# Patient Record
Sex: Female | Born: 1955 | Race: White | Hispanic: No | Marital: Married | Smoking: Never smoker
Health system: Southern US, Community
[De-identification: ages and names within clinical notes are randomized; demographics above are authoritative.]

## PROBLEM LIST (undated history)

## (undated) DIAGNOSIS — R87619 Unspecified abnormal cytological findings in specimens from cervix uteri: Secondary | ICD-10-CM

## (undated) DIAGNOSIS — M48061 Spinal stenosis, lumbar region without neurogenic claudication: Secondary | ICD-10-CM

## (undated) HISTORY — PX: TUBAL LIGATION: SHX77

## (undated) HISTORY — DX: Unspecified abnormal cytological findings in specimens from cervix uteri: R87.619

## (undated) HISTORY — DX: Spinal stenosis, lumbar region without neurogenic claudication: M48.061

## (undated) HISTORY — PX: COLONOSCOPY: SHX174

---

## 2000-04-02 ENCOUNTER — Encounter: Admission: RE | Admit: 2000-04-02 | Discharge: 2000-04-02 | Payer: Self-pay | Admitting: Family Medicine

## 2000-04-02 ENCOUNTER — Encounter: Payer: Self-pay | Admitting: Family Medicine

## 2001-07-22 ENCOUNTER — Encounter
Admission: RE | Admit: 2001-07-22 | Discharge: 2001-08-15 | Payer: Self-pay | Admitting: Physical Medicine and Rehabilitation

## 2001-09-26 ENCOUNTER — Ambulatory Visit (HOSPITAL_COMMUNITY): Admission: RE | Admit: 2001-09-26 | Discharge: 2001-09-26 | Payer: Self-pay | Admitting: *Deleted

## 2001-10-17 ENCOUNTER — Other Ambulatory Visit: Admission: RE | Admit: 2001-10-17 | Discharge: 2001-10-17 | Payer: Self-pay | Admitting: *Deleted

## 2003-11-24 ENCOUNTER — Encounter: Admission: RE | Admit: 2003-11-24 | Discharge: 2003-11-24 | Payer: Self-pay | Admitting: Obstetrics and Gynecology

## 2006-02-01 ENCOUNTER — Encounter: Admission: RE | Admit: 2006-02-01 | Discharge: 2006-02-01 | Payer: Self-pay | Admitting: Obstetrics and Gynecology

## 2007-07-03 ENCOUNTER — Encounter: Admission: RE | Admit: 2007-07-03 | Discharge: 2007-07-03 | Payer: Self-pay | Admitting: Obstetrics and Gynecology

## 2008-08-27 ENCOUNTER — Encounter: Admission: RE | Admit: 2008-08-27 | Discharge: 2008-08-27 | Payer: Self-pay | Admitting: Obstetrics and Gynecology

## 2008-12-28 ENCOUNTER — Encounter: Admission: RE | Admit: 2008-12-28 | Discharge: 2008-12-28 | Payer: Self-pay | Admitting: Orthopedic Surgery

## 2009-09-28 ENCOUNTER — Encounter: Admission: RE | Admit: 2009-09-28 | Discharge: 2009-09-28 | Payer: Self-pay | Admitting: Obstetrics and Gynecology

## 2011-03-30 ENCOUNTER — Other Ambulatory Visit: Payer: Self-pay | Admitting: Nurse Practitioner

## 2011-03-30 DIAGNOSIS — Z1231 Encounter for screening mammogram for malignant neoplasm of breast: Secondary | ICD-10-CM

## 2011-05-11 ENCOUNTER — Ambulatory Visit
Admission: RE | Admit: 2011-05-11 | Discharge: 2011-05-11 | Disposition: A | Discharge status: Home / Self Care | Payer: BC Managed Care – PPO | Source: Ambulatory Visit | Attending: Nurse Practitioner | Admitting: Nurse Practitioner

## 2011-05-11 ENCOUNTER — Other Ambulatory Visit: Payer: Self-pay | Admitting: Obstetrics and Gynecology

## 2011-05-11 DIAGNOSIS — Z1231 Encounter for screening mammogram for malignant neoplasm of breast: Secondary | ICD-10-CM

## 2011-05-17 ENCOUNTER — Other Ambulatory Visit: Payer: Self-pay | Admitting: Obstetrics and Gynecology

## 2011-05-19 ENCOUNTER — Other Ambulatory Visit: Payer: Self-pay | Admitting: Obstetrics and Gynecology

## 2011-05-19 DIAGNOSIS — R928 Other abnormal and inconclusive findings on diagnostic imaging of breast: Secondary | ICD-10-CM

## 2011-05-24 ENCOUNTER — Ambulatory Visit
Admission: RE | Admit: 2011-05-24 | Discharge: 2011-05-24 | Disposition: A | Discharge status: Home / Self Care | Payer: BC Managed Care – PPO | Source: Ambulatory Visit | Attending: Obstetrics and Gynecology | Admitting: Obstetrics and Gynecology

## 2011-05-24 DIAGNOSIS — R928 Other abnormal and inconclusive findings on diagnostic imaging of breast: Secondary | ICD-10-CM

## 2011-06-07 ENCOUNTER — Other Ambulatory Visit: Payer: BC Managed Care – PPO

## 2011-10-17 ENCOUNTER — Ambulatory Visit (INDEPENDENT_AMBULATORY_CARE_PROVIDER_SITE_OTHER): Payer: BC Managed Care – PPO | Admitting: Family Medicine

## 2011-10-17 ENCOUNTER — Ambulatory Visit: Payer: BC Managed Care – PPO

## 2011-10-17 DIAGNOSIS — M5412 Radiculopathy, cervical region: Secondary | ICD-10-CM

## 2011-10-17 DIAGNOSIS — R5383 Other fatigue: Secondary | ICD-10-CM

## 2011-10-17 DIAGNOSIS — R109 Unspecified abdominal pain: Secondary | ICD-10-CM

## 2011-10-17 LAB — POCT CBC
HCT, POC: 42.3 % (ref 37.7–47.9)
Hemoglobin: 13.7 g/dL (ref 12.2–16.2)
Lymph, poc: 3.4 (ref 0.6–3.4)
MCH, POC: 29.1 pg (ref 27–31.2)
MCHC: 32.4 g/dL (ref 31.8–35.4)
POC Granulocyte: 5.6 (ref 2–6.9)
POC LYMPH PERCENT: 35.3 %L (ref 10–50)
POC MID %: 6.8 %M (ref 0–12)
RDW, POC: 13.3 %
WBC: 9.7 10*3/uL (ref 4.6–10.2)

## 2011-10-17 MED ORDER — MELOXICAM 7.5 MG PO TABS: ORAL_TABLET | ORAL | Status: DC

## 2011-10-17 MED ORDER — GABAPENTIN 300 MG PO CAPS: 300.0000 mg | ORAL_CAPSULE | Freq: Three times a day (TID) | ORAL | Status: DC

## 2011-10-17 NOTE — Progress Notes (Signed)
  Subjective:    Patient ID: Catherine Robertson, female    DOB: [birth year], 56 y.o.   MRN: [id number]  HPI   Patient complains of knife like pain in (R) shoulder; denies injury to shoulder or known cervical spine disease.  Complains of vague abdominal pain; change in stool color and malaise  Malaise, fatigue, worried about health, tearful since February.   Primary MD Dr. Erling Robertson; Catherine Robertson at [facility]  Review of Systems     Objective:   Physical Exam  Constitutional: She appears well-developed.  Neck: Neck supple.  Cardiovascular: Normal rate, regular rhythm and normal heart sounds.   Pulmonary/Chest: Effort normal and breath sounds normal.  Abdominal: Soft. There is Tenderness: RUQ; + Murphy..  Neurological: She is alert.  Skin: Skin is warm.      [facility] reading (PRIMARY) by  Dr. Valaria Robertson level DDD; foraminal narrowing C 4-C 5, C 5-C6     Assessment & Plan:   1. Abdominal pain  POCT SEDIMENTATION RATE, Comprehensive metabolic panel, POCT CBC, US Abdomen Complete  2. Cervical radiculopathy  DG Cervical Spine Complete, meloxicam (MOBIC) 7.5 MG tablet, gabapentin (NEURONTIN) 300 MG capsule  3. Fatigue; depression  TSH; patient agrees to follow up with primary MD for care of depression.  Patient denies passive or active suicidality upon discharge

## 2011-10-18 ENCOUNTER — Encounter: Payer: Self-pay | Admitting: Family Medicine

## 2011-10-18 ENCOUNTER — Telehealth: Payer: Self-pay

## 2011-10-18 NOTE — Telephone Encounter (Signed)
.  [facility] The patient called to request instructions for how to take one of the medications that she was prescribed on 10/17/11 when she was seen by Dr. Hal Hope.  Please call the patient to review instructions for her medications at [phone].

## 2011-10-18 NOTE — Telephone Encounter (Signed)
PT NOTIFIED AND TO TAKE MEDS AS DIRECTED ON THE LABEL

## 2011-10-19 ENCOUNTER — Telehealth: Payer: Self-pay

## 2011-10-19 LAB — COMPREHENSIVE METABOLIC PANEL
AST: 17 U/L (ref 0–37)
Albumin: 4.6 g/dL (ref 3.5–5.2)
Alkaline Phosphatase: 77 U/L (ref 39–117)
BUN: 20 mg/dL (ref 6–23)
Calcium: 9.5 mg/dL (ref 8.4–10.5)
Creat: 0.9 mg/dL (ref 0.50–1.10)
Glucose, Bld: 109 mg/dL — ABNORMAL HIGH (ref 70–99)
Potassium: 4.1 mEq/L (ref 3.5–5.3)

## 2011-10-19 NOTE — Telephone Encounter (Signed)
Notified pt that xrays are ready for p/up

## 2011-10-19 NOTE — Telephone Encounter (Signed)
Pt is requesting a copy of her x-ray please call when ready for pick-up [phone]

## 2012-05-02 ENCOUNTER — Emergency Department (HOSPITAL_COMMUNITY)
Admission: EM | Admit: 2012-05-02 | Discharge: 2012-05-03 | Disposition: A | Discharge status: Home / Self Care | Payer: BC Managed Care – PPO | Attending: Emergency Medicine | Admitting: Emergency Medicine

## 2012-05-02 ENCOUNTER — Encounter (HOSPITAL_COMMUNITY): Payer: Self-pay | Admitting: Emergency Medicine

## 2012-05-02 ENCOUNTER — Emergency Department (HOSPITAL_COMMUNITY): Payer: BC Managed Care – PPO

## 2012-05-02 DIAGNOSIS — S298XXA Other specified injuries of thorax, initial encounter: Secondary | ICD-10-CM | POA: Insufficient documentation

## 2012-05-02 DIAGNOSIS — Y929 Unspecified place or not applicable: Secondary | ICD-10-CM | POA: Insufficient documentation

## 2012-05-02 DIAGNOSIS — S199XXA Unspecified injury of neck, initial encounter: Secondary | ICD-10-CM | POA: Insufficient documentation

## 2012-05-02 DIAGNOSIS — S0993XA Unspecified injury of face, initial encounter: Secondary | ICD-10-CM | POA: Insufficient documentation

## 2012-05-02 DIAGNOSIS — Y9389 Activity, other specified: Secondary | ICD-10-CM | POA: Insufficient documentation

## 2012-05-02 DIAGNOSIS — S8990XA Unspecified injury of unspecified lower leg, initial encounter: Secondary | ICD-10-CM | POA: Insufficient documentation

## 2012-05-02 DIAGNOSIS — M25569 Pain in unspecified knee: Secondary | ICD-10-CM

## 2012-05-02 DIAGNOSIS — S99929A Unspecified injury of unspecified foot, initial encounter: Secondary | ICD-10-CM | POA: Insufficient documentation

## 2012-05-02 MED ORDER — ONDANSETRON HCL 4 MG/2ML IJ SOLN
4.0000 mg | Freq: Once | INTRAMUSCULAR | Status: AC
  Administered 2012-05-02: 4 mg via INTRAVENOUS
  Filled 2012-05-02: qty 2

## 2012-05-02 MED ORDER — IOHEXOL 300 MG/ML  SOLN
80.0000 mL | Freq: Once | INTRAMUSCULAR | Status: AC | PRN
  Administered 2012-05-02: 80 mL via INTRAVENOUS

## 2012-05-02 MED ORDER — LORAZEPAM 2 MG/ML IJ SOLN
0.5000 mg | Freq: Once | INTRAMUSCULAR | Status: AC
  Administered 2012-05-02: 0.5 mg via INTRAVENOUS
  Filled 2012-05-02: qty 1

## 2012-05-02 MED ORDER — ONDANSETRON HCL 4 MG/2ML IJ SOLN
4.0000 mg | Freq: Once | INTRAMUSCULAR | Status: AC
  Administered 2012-05-02: 4 mg via INTRAVENOUS

## 2012-05-02 MED ORDER — MORPHINE SULFATE 4 MG/ML IJ SOLN
6.0000 mg | Freq: Once | INTRAMUSCULAR | Status: AC
  Administered 2012-05-02: 6 mg via INTRAVENOUS
  Filled 2012-05-02: qty 2

## 2012-05-02 MED ORDER — ONDANSETRON HCL 4 MG/2ML IJ SOLN
INTRAMUSCULAR | Status: AC
  Administered 2012-05-02: 4 mg via INTRAVENOUS
  Filled 2012-05-02: qty 2

## 2012-05-02 NOTE — ED Notes (Signed)
Patient transported to CT 

## 2012-05-02 NOTE — ED Notes (Signed)
Patient transported to X-ray 
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
## 2012-05-03 MED ORDER — HYDROCODONE-ACETAMINOPHEN 5-325 MG PO TABS: 1.0000 | ORAL_TABLET | Freq: Four times a day (QID) | ORAL | Status: DC | PRN
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
## 2012-05-03 NOTE — ED Notes (Signed)
Assisted pt to change into paper scrubs.  Pt ambulated in hallway without difficulty.  Declines crutches.

## 2012-05-03 NOTE — ED Provider Notes (Signed)
Patient feeling better, nausea and vomiting currently under control.  Radiology and lab results reviewed, discussed with Dr. Patria Mane, shared with patient/family.  Patient ambulatory with minimal assistance.  Patient discharged home with follow-up with PCP and orthopedics as needed.  Jimmye Norman, NP 05/03/12 [phone]

## 2012-05-04 NOTE — ED Provider Notes (Signed)
I was the primary provider of this patient during this ER visit. The patients care was continued in the CDU and managed in conjunction with my midlevel providers   Lyanne Co, MD 05/04/12 2054

## 2012-05-07 ENCOUNTER — Ambulatory Visit
Admission: RE | Admit: 2012-05-07 | Discharge: 2012-05-07 | Disposition: A | Discharge status: Home / Self Care | Payer: BC Managed Care – PPO | Source: Ambulatory Visit | Attending: Family Medicine | Admitting: Family Medicine

## 2012-05-07 ENCOUNTER — Other Ambulatory Visit: Payer: Self-pay | Admitting: Family Medicine

## 2012-05-07 DIAGNOSIS — M79669 Pain in unspecified lower leg: Secondary | ICD-10-CM

## 2012-05-07 DIAGNOSIS — M7989 Other specified soft tissue disorders: Secondary | ICD-10-CM

## 2012-07-01 ENCOUNTER — Other Ambulatory Visit: Payer: Self-pay | Admitting: Obstetrics and Gynecology

## 2012-07-01 DIAGNOSIS — Z1231 Encounter for screening mammogram for malignant neoplasm of breast: Secondary | ICD-10-CM

## 2012-07-31 ENCOUNTER — Ambulatory Visit
Admission: RE | Admit: 2012-07-31 | Discharge: 2012-07-31 | Disposition: A | Discharge status: Home / Self Care | Payer: BC Managed Care – PPO | Source: Ambulatory Visit | Attending: Obstetrics and Gynecology | Admitting: Obstetrics and Gynecology

## 2012-07-31 DIAGNOSIS — Z1231 Encounter for screening mammogram for malignant neoplasm of breast: Secondary | ICD-10-CM

## 2012-08-23 ENCOUNTER — Other Ambulatory Visit: Payer: Self-pay | Admitting: Obstetrics and Gynecology

## 2012-09-02 ENCOUNTER — Telehealth: Payer: Self-pay | Admitting: Obstetrics and Gynecology

## 2012-09-02 NOTE — Telephone Encounter (Signed)
PT RETURNING AMANDA'S CALL.

## 2012-09-05 NOTE — Telephone Encounter (Signed)
Pt. Notified labs wnl.

## 2013-09-02 ENCOUNTER — Encounter: Payer: Self-pay | Admitting: Gastroenterology
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
## 2013-11-18 ENCOUNTER — Encounter: Payer: Self-pay | Admitting: Obstetrics & Gynecology

## 2013-11-20 ENCOUNTER — Ambulatory Visit (INDEPENDENT_AMBULATORY_CARE_PROVIDER_SITE_OTHER): Payer: BC Managed Care – PPO | Admitting: Obstetrics & Gynecology

## 2013-11-20 ENCOUNTER — Encounter: Payer: Self-pay | Admitting: Obstetrics & Gynecology

## 2013-11-20 VITALS — BP 140/84 | HR 86 | Temp 98.8°F | Ht 65.0 in | Wt 190.4 lb

## 2013-11-20 DIAGNOSIS — Z01419 Encounter for gynecological examination (general) (routine) without abnormal findings: Secondary | ICD-10-CM

## 2013-11-20 DIAGNOSIS — Z1239 Encounter for other screening for malignant neoplasm of breast: Secondary | ICD-10-CM

## 2013-11-20 DIAGNOSIS — M48061 Spinal stenosis, lumbar region without neurogenic claudication: Secondary | ICD-10-CM

## 2013-11-20 DIAGNOSIS — R3129 Other microscopic hematuria: Secondary | ICD-10-CM

## 2013-11-20 DIAGNOSIS — E2839 Other primary ovarian failure: Secondary | ICD-10-CM

## 2013-11-20 DIAGNOSIS — Z0000 Encounter for general adult medical examination without abnormal findings: Secondary | ICD-10-CM

## 2013-11-20 LAB — POCT URINALYSIS DIPSTICK
BILIRUBIN UA: NEGATIVE
Blood, UA: 50
GLUCOSE UA: NEGATIVE
KETONES UA: NEGATIVE
NITRITE UA: NEGATIVE
Protein, UA: NEGATIVE
Urobilinogen, UA: NEGATIVE
pH, UA: 5

## 2013-11-20 NOTE — Progress Notes (Signed)
Patient is a prior patient of Dr. Jacelyn Grip at [facility].  She is scheduled for a follow up with Dr. Jacelyn Grip for 6/17 at 9:15. Written appointment information given. Agreeable to time/date/location.

## 2013-11-20 NOTE — Patient Instructions (Signed)

## 2013-11-20 NOTE — Addendum Note (Signed)
Addended by: Michele Mcalpine on: 11/20/2013 11:31 AM   Modules accepted: Orders

## 2013-11-20 NOTE — Progress Notes (Signed)
Patient ID: Catherine Robertson, female   DOB: 1955/12/07, 58 y.o.   MRN: [id number]  58 y.o. G3P3 MarriedCaucasianF here for annual exam.  No vaginal bleeding.  Dr. Moreen Fowler sees pt regularly.  Pt reports exam is planned for October.  Will do blood work then.   Last day of school is June 16th.  Not working in the summer.  Reports this is her "me" time.    Pt aware colonoscopy due.  She will schedule.    Patient's last menstrual period was 06/12/2010.          Sexually active: yes The current method of family planning is post menopausal status.    Exercising: yes  walking Smoker:  no  Health Maintenance: Pap:  08/23/12 neg with neg HR HPV History of abnormal Pap:  Yes, year ago MMG:  3/14 Colonoscopy:  2005.  Pt has gotten information from [facility] about appt being due.   BMD:   2010 normal TDaP:  2012 Screening Labs: With Dr. Duncan Dull, Urine today: +1 RBC, trace WBC   reports that she has never smoked. She has never used smokeless tobacco. She reports that she drinks alcohol. She reports that she does not use illicit drugs.  Past Medical History  Diagnosis Date  . Abnormal Pap smear of cervix     H/O  . Lumbar spinal stenosis     History reviewed. No pertinent past surgical history.  Current Outpatient Prescriptions  Medication Sig Dispense Refill  . cholecalciferol (VITAMIN D) 400 UNITS TABS tablet Take 400 Units by mouth every morning. 2 tabs daily       No current facility-administered medications for this visit.    Family History  Problem Relation Age of Onset  . Lung cancer Mother   . Depression Mother   . Lung cancer Father   . Heart attack Sister   . Breast cancer Maternal Aunt 47    deceased    ROS:  Pertinent items are noted in HPI.  Otherwise, a comprehensive ROS was negative.  Exam:   BP 140/84  Pulse 86  Temp(Src) 98.8 F (37.1 C)  Ht 5\' 5"  (1.651 m)  Wt 190 lb 6.4 oz (86.365 kg)  BMI 31.68 kg/m2  LMP 06/12/2010  Weight change: +8#   Height: 5\' 5"   (165.1 cm)  Ht Readings from Last 3 Encounters:  11/20/13 5\' 5"  (1.651 m)  10/17/11 5\' 6"  (1.676 m)    General appearance: alert, cooperative and appears stated age Head: Normocephalic, without obvious abnormality, atraumatic Neck: no adenopathy, supple, symmetrical, trachea midline and thyroid normal to inspection and palpation Lungs: clear to auscultation bilaterally Breasts: normal appearance, no masses or tenderness Heart: regular rate and rhythm Abdomen: soft, non-tender; bowel sounds normal; no masses,  no organomegaly Extremities: extremities normal, atraumatic, no cyanosis or edema Skin: Skin color, texture, turgor normal. No rashes or lesions Lymph nodes: Cervical, supraclavicular, and axillary nodes normal. No abnormal inguinal nodes palpated Neurologic: Grossly normal   Pelvic: External genitalia:  no lesions              Urethra:  normal appearing urethra with no masses, tenderness or lesions              Bartholins and Skenes: normal                 Vagina: normal appearing vagina with normal color and discharge, no lesions              Cervix: no lesions  Pap taken: no Bimanual Exam:  Uterus:  normal size, contour, position, consistency, mobility, non-tender              Adnexa: normal adnexa and no mass, fullness, tenderness               Rectovaginal: Confirms               Anus:  normal sphincter tone, no lesions  A:  Well Woman with normal exam H/O lumbar spinal stenosis PMP, no HRT Microscopic hematuria  P:   Mammogram due.  Pt aware and she will scheudled.  D/W pt 3D MMG due to dense breast tissue pap smear with neg HR HPV 2014.  No pap indicated today Labs with Dr. Moreen Fowler Urine micro and culture BMD ordered Will refer back to [place] ortho regarding back pain Colonoscopy due.  Pt to schedule.  Offered to help.  She declines for now. return annually or prn  An After Visit Summary was printed and given to the patient.

## 2013-11-21 LAB — URINALYSIS, MICROSCOPIC ONLY
Bacteria, UA: NONE SEEN
CRYSTALS: NONE SEEN
Casts: NONE SEEN
SQUAMOUS EPITHELIAL / LPF: NONE SEEN

## 2013-11-21 LAB — URINE CULTURE: Colony Count: 9000

## 2013-11-21 LAB — HEMOGLOBIN, FINGERSTICK: HEMOGLOBIN, FINGERSTICK: 15 g/dL (ref 12.0–16.0)

## 2013-11-24 ENCOUNTER — Telehealth: Payer: Self-pay

## 2013-11-24 NOTE — Telephone Encounter (Signed)
Message copied by Robley Fries on Mon Nov 24, 2013 12:27 PM ------      Message from: Megan Salon      Created: Sat Nov 22, 2013 11:02 PM       Dip u/a in office showed some WBCs but micro was negative and culture was negative.  No treatment needed. ------

## 2013-11-24 NOTE — Telephone Encounter (Signed)
Lmtcb//kn 

## 2013-11-25 NOTE — Telephone Encounter (Signed)
Patient notified of results.//kn 

## 2014-01-14 ENCOUNTER — Encounter: Payer: Self-pay | Admitting: Gastroenterology

## 2014-01-28 ENCOUNTER — Ambulatory Visit
Admission: RE | Admit: 2014-01-28 | Discharge: 2014-01-28 | Disposition: A | Discharge status: Home / Self Care | Payer: BC Managed Care – PPO | Source: Ambulatory Visit | Attending: Obstetrics & Gynecology | Admitting: Obstetrics & Gynecology

## 2014-01-28 DIAGNOSIS — E2839 Other primary ovarian failure: Secondary | ICD-10-CM

## 2014-01-28 DIAGNOSIS — Z1239 Encounter for other screening for malignant neoplasm of breast: Secondary | ICD-10-CM

## 2014-03-16 ENCOUNTER — Ambulatory Visit (AMBULATORY_SURGERY_CENTER): Payer: Self-pay | Admitting: *Deleted

## 2014-03-16 VITALS — Ht 65.0 in | Wt 189.4 lb

## 2014-03-16 DIAGNOSIS — Z1211 Encounter for screening for malignant neoplasm of colon: Secondary | ICD-10-CM

## 2014-03-16 MED ORDER — NA SULFATE-K SULFATE-MG SULF 17.5-3.13-1.6 GM/177ML PO SOLN: 1.0000 | Freq: Once | ORAL | Status: DC

## 2014-03-16 NOTE — Progress Notes (Signed)
NO EGG OR SOY ALLERGY. EWM NO HOME 02 USE. EWM NO PROBLEMS WITH PAST SEDATION. EWM NO DIET PILLS, NO BLOOD THINNERS. EWM EMMI VIDEO TO PT. EWM

## 2014-03-23 ENCOUNTER — Encounter: Payer: Self-pay | Admitting: Gastroenterology

## 2014-03-30 ENCOUNTER — Ambulatory Visit (AMBULATORY_SURGERY_CENTER): Payer: BC Managed Care – PPO | Admitting: Gastroenterology

## 2014-03-30 ENCOUNTER — Encounter: Payer: Self-pay | Admitting: Gastroenterology

## 2014-03-30 VITALS — BP 176/98 | HR 74 | Temp 96.7°F | Resp 17 | Ht 65.0 in | Wt 189.0 lb

## 2014-03-30 DIAGNOSIS — Z1211 Encounter for screening for malignant neoplasm of colon: Secondary | ICD-10-CM

## 2014-03-30 DIAGNOSIS — D120 Benign neoplasm of cecum: Secondary | ICD-10-CM

## 2014-03-30 DIAGNOSIS — K635 Polyp of colon: Secondary | ICD-10-CM

## 2014-03-30 DIAGNOSIS — K648 Other hemorrhoids: Secondary | ICD-10-CM

## 2014-03-30 MED ORDER — SODIUM CHLORIDE 0.9 % IV SOLN: 500.0000 mL | INTRAVENOUS | Status: DC

## 2014-03-30 NOTE — Progress Notes (Signed)
Pt stable to RR 

## 2014-03-30 NOTE — Progress Notes (Signed)
Called to room to assist during endoscopic procedure.  Patient ID and intended procedure confirmed with present staff. Received instructions for my participation in the procedure from the performing physician.  

## 2014-03-30 NOTE — Patient Instructions (Signed)

## 2014-03-30 NOTE — Progress Notes (Signed)
FOLLOW DISCHARGE INSTRUCTIONS (BLUE & GREEN SHEETS).    

## 2014-03-30 NOTE — Op Note (Signed)
[facility]  [address] [place], [id number]   COLONOSCOPY PROCEDURE REPORT  PATIENT: [name], [name]  MR#: [id number] BIRTHDATE: [birth year] , 58  yrs. old GENDER: female ENDOSCOPIST: Inda Castle, MD REFERRED BY: PROCEDURE DATE:  03/30/2014 PROCEDURE:   Colonoscopy with snare polypectomy First Screening Colonoscopy - Avg.  risk and is 50 yrs.  old or older Yes.  Prior Negative Screening - Now for repeat screening. N/A  History of Adenoma - Now for follow-up colonoscopy & has been > or = to 3 yrs.  N/A  Polyps Removed Today? Yes. ASA CLASS:   Class II INDICATIONS:average risk for colon cancer. MEDICATIONS: Monitored anesthesia care and Propofol 200 mg IV  DESCRIPTION OF PROCEDURE:   After the risks benefits and alternatives of the procedure were thoroughly explained, informed consent was obtained.  The digital rectal exam revealed no abnormalities of the rectum.   The LB [id number]  endoscope was introduced through the anus and advanced to the cecum, which was identified by both the appendix and ileocecal valve. No adverse events experienced.   The quality of the prep was excellent using Suprep  The instrument was then slowly withdrawn as the colon was fully examined.      COLON FINDINGS: A flat polyp measuring 4 mm in size was found at the cecum.  A polypectomy was performed with a cold snare.  The resection was complete, the polyp tissue was completely retrieved and sent to histology.   Internal hemorrhoids were found.   The examination was otherwise normal.  Retroflexed views revealed no abnormalities. The time to cecum=4 minutes 49 seconds.  Withdrawal time=7 minutes 50 seconds.  The scope was withdrawn and the procedure completed. COMPLICATIONS: There were no immediate complications.  ENDOSCOPIC IMPRESSION: 1.   Flat polyp measuring 4 mm in size was found at the cecum; polypectomy was performed with a cold snare 2.   Internal  hemorrhoids 3.   The examination was otherwise normal  RECOMMENDATIONS: If the polyp(s) removed today are proven to be adenomatous (pre-cancerous) polyps, you will need a repeat colonoscopy in 5 years.  Otherwise you should continue to follow colorectal cancer screening guidelines for "routine risk" patients with colonoscopy in 10 years.  You will receive a letter within 1-2 weeks with the results of your biopsy as well as final recommendations.  Please call my office if you have not received a letter after 3 weeks.  eSigned:  Inda Castle, MD 03/30/2014 11:04 AM   cc:   PATIENT NAME:  Catherine Robertson, Catherine Robertson MR#: [id number]

## 2014-03-31 ENCOUNTER — Telehealth: Payer: Self-pay | Admitting: *Deleted

## 2014-03-31 NOTE — Telephone Encounter (Signed)
Pt refused to answer any questions, pt states she is busy and will call back, when asked if pt was doing ok pt states "well I guess", pt was asked it she was back to normal, pt states "no but I am busy and will call back!"-adm

## 2014-04-06 ENCOUNTER — Encounter: Payer: Self-pay | Admitting: Gastroenterology

## 2014-04-13 ENCOUNTER — Encounter: Payer: Self-pay | Admitting: Gastroenterology

## 2014-12-11 ENCOUNTER — Ambulatory Visit: Payer: BC Managed Care – PPO | Admitting: Obstetrics and Gynecology

## 2014-12-17 ENCOUNTER — Ambulatory Visit (INDEPENDENT_AMBULATORY_CARE_PROVIDER_SITE_OTHER): Payer: BC Managed Care – PPO | Admitting: Obstetrics & Gynecology

## 2014-12-17 ENCOUNTER — Encounter: Payer: Self-pay | Admitting: Obstetrics & Gynecology

## 2014-12-17 VITALS — BP 138/82 | HR 60 | Resp 16 | Ht 64.75 in | Wt 183.0 lb

## 2014-12-17 DIAGNOSIS — Z0000 Encounter for general adult medical examination without abnormal findings: Secondary | ICD-10-CM

## 2014-12-17 DIAGNOSIS — Z01419 Encounter for gynecological examination (general) (routine) without abnormal findings: Secondary | ICD-10-CM | POA: Diagnosis not present

## 2014-12-17 DIAGNOSIS — Z124 Encounter for screening for malignant neoplasm of cervix: Secondary | ICD-10-CM | POA: Diagnosis not present

## 2014-12-17 DIAGNOSIS — R899 Unspecified abnormal finding in specimens from other organs, systems and tissues: Secondary | ICD-10-CM

## 2014-12-17 DIAGNOSIS — M4806 Spinal stenosis, lumbar region: Secondary | ICD-10-CM

## 2014-12-17 DIAGNOSIS — M48061 Spinal stenosis, lumbar region without neurogenic claudication: Secondary | ICD-10-CM | POA: Insufficient documentation

## 2014-12-17 LAB — POCT URINALYSIS DIPSTICK
Bilirubin, UA: NEGATIVE
Glucose, UA: NEGATIVE
Ketones, UA: NEGATIVE
Leukocytes, UA: NEGATIVE
Nitrite, UA: NEGATIVE
PROTEIN UA: NEGATIVE
RBC UA: NEGATIVE
UROBILINOGEN UA: NEGATIVE
pH, UA: 5

## 2014-12-17 NOTE — Progress Notes (Signed)
59 y.o. G3P3 MarriedCaucasianF here for annual exam.  Doing well.  Denies vaginal bleeding.  Going to [facility] with her camper at the end of the week.  Confirmed pt's release of records with her.  Ok to leave detailed message on her cell phone but she doesn't always have cell service, depending on where she ends up camping.  Pt's biggest issue is with joint pain/stiffness.  Pt was referred to ortho last year.  Cancelled appt due to mother in law having new diagnosis of colon cancer.  Mother in law is not living with pt and her husband.  She did not have a colostomy placed.  She is doing quite well.    PCP:  Dr. Moreen Fowler.  Hasn't seen him in over a year.  Would like blood work done today.  Patient's last menstrual period was 06/12/2010.          Sexually active: No.  The current method of family planning is post menopausal status.    Exercising: Yes.    walking Smoker:  no  Health Maintenance: Pap:  08/23/12 WNL/negative HR HPV History of abnormal Pap:  no MMG:  01/28/14-normal Colonoscopy:  2015-repeat in 5 years.  Polyp was noted.   BMD:   01/28/14-normal TDaP:  2012 Screening Labs: today, Hb today: 15, Urine today: negative   reports that she has never smoked. She has never used smokeless tobacco. She reports that she drinks alcohol. She reports that she does not use illicit drugs.  Past Medical History  Diagnosis Date  . Abnormal Pap smear of cervix     H/O  . Lumbar spinal stenosis     Past Surgical History  Procedure Laterality Date  . Colonoscopy      2005 [facility]  . Tubal ligation      31 YRS AGO    Current Outpatient Prescriptions  Medication Sig Dispense Refill  . cholecalciferol (VITAMIN D) 400 UNITS TABS tablet Take 400 Units by mouth every morning. 2 tabs daily     No current facility-administered medications for this visit.    Family History  Problem Relation Age of Onset  . Lung cancer Mother   . Depression Mother   . Lung cancer Father   . Heart attack  Sister   . Breast cancer Maternal Aunt 47    deceased    ROS:  Pertinent items are noted in HPI.  Otherwise, a comprehensive ROS was negative.  Exam:   General appearance: alert, cooperative and appears stated age Head: Normocephalic, without obvious abnormality, atraumatic Neck: no adenopathy, supple, symmetrical, trachea midline and thyroid normal to inspection and palpation Lungs: clear to auscultation bilaterally Breasts: normal appearance, no masses or tenderness Heart: regular rate and rhythm Abdomen: soft, non-tender; bowel sounds normal; no masses,  no organomegaly Extremities: extremities normal, atraumatic, no cyanosis or edema Skin: Skin color, texture, turgor normal. No rashes or lesions Lymph nodes: Cervical, supraclavicular, and axillary nodes normal. No abnormal inguinal nodes palpated Neurologic: Grossly normal   Pelvic: External genitalia:  no lesions              Urethra:  normal appearing urethra with no masses, tenderness or lesions              Bartholins and Skenes: normal                 Vagina: normal appearing vagina with normal color and discharge, no lesions  Cervix: no lesions              Pap taken: Yes.   Bimanual Exam:  Uterus:  normal size, contour, position, consistency, mobility, non-tender              Adnexa: normal adnexa and no mass, fullness, tenderness               Rectovaginal: Confirms               Anus:  normal sphincter tone, no lesions  Chaperone was present for exam.  A:  Well Woman with normal exam H/O lumbar spinal stenosis PMP, no HRT H/O microscopic hematuria on dip u/a.  Negative micro 2015.   Joint pain/stiffness  P: Mammogram due. Last done 8/15. pap smear with neg HR HPV 2014. Pap done today. CMP, TSH, Vit D, Lipids Will refer to ortho again this year.  Pt will call and give me dates for appts.  Doesn't have calendar with her now.   return annually or prn

## 2014-12-18 LAB — LIPID PANEL
CHOL/HDL RATIO: 3.1 ratio
Cholesterol: 179 mg/dL (ref 0–200)
HDL: 58 mg/dL (ref >=46)
LDL Cholesterol: 95 mg/dL (ref 0–99)
Triglycerides: 129 mg/dL (ref <150)
VLDL: 26 mg/dL (ref 0–40)

## 2014-12-18 LAB — COMPREHENSIVE METABOLIC PANEL
ALK PHOS: 69 U/L (ref 39–117)
ALT: 18 U/L (ref 0–35)
AST: 20 U/L (ref 0–37)
Albumin: 4.3 g/dL (ref 3.5–5.2)
BILIRUBIN TOTAL: 0.6 mg/dL (ref 0.2–1.2)
BUN: 19 mg/dL (ref 6–23)
CO2: 23 meq/L (ref 19–32)
CREATININE: 0.75 mg/dL (ref 0.50–1.10)
Calcium: 9.2 mg/dL (ref 8.4–10.5)
Chloride: 104 mEq/L (ref 96–112)
GLUCOSE: 88 mg/dL (ref 70–99)
POTASSIUM: 4.3 meq/L (ref 3.5–5.3)
SODIUM: 140 meq/L (ref 135–145)
Total Protein: 7.1 g/dL (ref 6.0–8.3)

## 2014-12-18 LAB — IPS PAP TEST WITH REFLEX TO HPV

## 2014-12-18 LAB — VITAMIN D 25 HYDROXY (VIT D DEFICIENCY, FRACTURES): Vit D, 25-Hydroxy: 28 ng/mL — ABNORMAL LOW (ref 30–100)

## 2014-12-18 LAB — TSH: TSH: 4.6 u[IU]/mL — AB (ref 0.350–4.500)

## 2014-12-18 NOTE — Addendum Note (Signed)
Addended by: Megan Salon on: 12/18/2014 12:22 PM   Modules accepted: Orders, SmartSet

## 2014-12-21 LAB — HEMOGLOBIN, FINGERSTICK: HEMOGLOBIN, FINGERSTICK: 15 g/dL (ref 12.0–16.0)

## 2014-12-24 ENCOUNTER — Telehealth: Payer: Self-pay

## 2014-12-24 NOTE — Telephone Encounter (Signed)
Lmtcb//kn 

## 2014-12-24 NOTE — Telephone Encounter (Signed)
-----   Message from Megan Salon, MD sent at 12/18/2014 12:21 PM EDT ----- Please inform pt Vit D is low.  She is taking OTC Vit D 400IU (I think two tabs, so 800 IU) daily.  If doing this faithfully, I think needs to increase to 2000 IU daily.  TSH was mildly elevated meaning her thyroid function may be a little low.  Before starting on any medication, I'd like to repeat in six weeks.  Order placed for future lab.  Cholesterol and CMP were normal.

## 2015-01-28 ENCOUNTER — Telehealth: Payer: Self-pay | Admitting: *Deleted

## 2015-01-28 NOTE — Telephone Encounter (Signed)
-----   Message from Megan Salon, MD sent at 12/18/2014 12:21 PM EDT ----- Please inform pt Vit D is low.  She is taking OTC Vit D 400IU (I think two tabs, so 800 IU) daily.  If doing this faithfully, I think needs to increase to 2000 IU daily.  TSH was mildly elevated meaning her thyroid function may be a little low.  Before starting on any medication, I'd like to repeat in six weeks.  Order placed for future lab.  Cholesterol and CMP were normal.

## 2015-01-28 NOTE — Telephone Encounter (Signed)
Left Message To Call Back  

## 2015-01-29 NOTE — Telephone Encounter (Signed)
Patient notified 01/28/15

## 2015-02-01 ENCOUNTER — Other Ambulatory Visit (INDEPENDENT_AMBULATORY_CARE_PROVIDER_SITE_OTHER): Payer: BC Managed Care – PPO

## 2015-02-01 DIAGNOSIS — R899 Unspecified abnormal finding in specimens from other organs, systems and tissues: Secondary | ICD-10-CM

## 2015-02-01 LAB — THYROID PANEL WITH TSH
FREE THYROXINE INDEX: 1.9 (ref 1.4–3.8)
T3 UPTAKE: 25 % (ref 22–35)
T4, Total: 7.4 ug/dL (ref 4.5–12.0)
TSH: 5.138 u[IU]/mL — AB (ref 0.350–4.500)

## 2015-02-01 NOTE — Telephone Encounter (Signed)
Patient notified of all results.//kn 

## 2015-02-02 ENCOUNTER — Telehealth: Payer: Self-pay

## 2015-02-02 NOTE — Telephone Encounter (Signed)
Left message to call Catherine Robertson at [phone]. 

## 2015-02-02 NOTE — Telephone Encounter (Signed)
-----   Message from Megan Salon, MD sent at 02/02/2015 12:39 PM EDT ----- Please inform TSH was a little higher, showing a little bit low thyroid function.  Could start low dose synthroid, could follow up with PCP, could see endocrinologist.  I would start with 2mcg synthroid and repeat level in three months.  Ok to send to pharmacy and schedule follow up if pt desires starting medication.

## 2015-02-05 NOTE — Telephone Encounter (Signed)
Spoke with patient. Advised of results as seen below from Ronald. Patient would like to think about what she would like to do over the weekend and return call on Monday with a decision. Advised I will let Dr.Miller know and will follow up with her on Monday if I do not hear from her. Patient is agreeable.  Routing to [place] as Juluis Rainier.

## 2015-02-19 NOTE — Telephone Encounter (Signed)
Left message to call Kaitlyn at [phone]. 

## 2015-02-23 NOTE — Progress Notes (Signed)
Opened in error

## 2015-02-23 NOTE — Telephone Encounter (Signed)
Left message to call Deeanna Beightol at [phone]. 

## 2015-03-05 NOTE — Telephone Encounter (Signed)
Dr.Miller, I have attempted to reach this patient multiple times with no return call to discuss treatment of elevated TSH. Okay to send letter at this time?

## 2015-03-05 NOTE — Telephone Encounter (Signed)
Letter written and sent to patient requesting return call to office regarding recent labs and requested follow up.  Will close encounter.

## 2015-03-05 NOTE — Telephone Encounter (Signed)
Please send letter and remove from inbox.

## 2016-02-01 ENCOUNTER — Other Ambulatory Visit: Payer: Self-pay | Admitting: Obstetrics & Gynecology

## 2016-02-01 ENCOUNTER — Other Ambulatory Visit: Payer: Self-pay | Admitting: Family Medicine

## 2016-02-01 DIAGNOSIS — Z1231 Encounter for screening mammogram for malignant neoplasm of breast: Secondary | ICD-10-CM

## 2016-02-07 ENCOUNTER — Ambulatory Visit
Admission: RE | Admit: 2016-02-07 | Discharge: 2016-02-07 | Disposition: A | Discharge status: Home / Self Care | Payer: BC Managed Care – PPO | Source: Ambulatory Visit | Attending: Family Medicine | Admitting: Family Medicine

## 2016-02-07 DIAGNOSIS — Z1231 Encounter for screening mammogram for malignant neoplasm of breast: Secondary | ICD-10-CM

## 2016-02-09 ENCOUNTER — Other Ambulatory Visit: Payer: Self-pay | Admitting: Family Medicine

## 2016-02-09 DIAGNOSIS — R928 Other abnormal and inconclusive findings on diagnostic imaging of breast: Secondary | ICD-10-CM

## 2016-03-16 ENCOUNTER — Ambulatory Visit: Payer: BC Managed Care – PPO | Admitting: Obstetrics & Gynecology

## 2016-03-22 ENCOUNTER — Ambulatory Visit
Admission: RE | Admit: 2016-03-22 | Discharge: 2016-03-22 | Disposition: A | Discharge status: Home / Self Care | Payer: BC Managed Care – PPO | Source: Ambulatory Visit | Attending: Family Medicine | Admitting: Family Medicine

## 2016-03-22 DIAGNOSIS — R928 Other abnormal and inconclusive findings on diagnostic imaging of breast: Secondary | ICD-10-CM

## 2018-01-11 ENCOUNTER — Other Ambulatory Visit: Payer: Self-pay | Admitting: Family Medicine

## 2018-01-11 DIAGNOSIS — Z1231 Encounter for screening mammogram for malignant neoplasm of breast: Secondary | ICD-10-CM

## 2018-02-13 ENCOUNTER — Ambulatory Visit
Admission: RE | Admit: 2018-02-13 | Discharge: 2018-02-13 | Disposition: A | Discharge status: Home / Self Care | Payer: BC Managed Care – PPO | Source: Ambulatory Visit | Attending: Family Medicine | Admitting: Family Medicine

## 2018-02-13 DIAGNOSIS — Z1231 Encounter for screening mammogram for malignant neoplasm of breast: Secondary | ICD-10-CM

## 2018-02-14 ENCOUNTER — Other Ambulatory Visit: Payer: Self-pay | Admitting: Family Medicine

## 2018-02-14 DIAGNOSIS — R928 Other abnormal and inconclusive findings on diagnostic imaging of breast: Secondary | ICD-10-CM

## 2018-03-05 ENCOUNTER — Ambulatory Visit
Admission: RE | Admit: 2018-03-05 | Discharge: 2018-03-05 | Disposition: A | Discharge status: Home / Self Care | Payer: BC Managed Care – PPO | Source: Ambulatory Visit | Attending: Family Medicine | Admitting: Family Medicine

## 2018-03-05 DIAGNOSIS — R928 Other abnormal and inconclusive findings on diagnostic imaging of breast: Secondary | ICD-10-CM

## 2019-03-19 ENCOUNTER — Encounter: Payer: Self-pay | Admitting: Gastroenterology
